# Patient Record
Sex: Male | Born: 1975 | Race: Black or African American | Hispanic: No | Marital: Married | Smoking: Current every day smoker
Health system: Southern US, Community
[De-identification: ages and names within clinical notes are randomized; demographics above are authoritative.]

## PROBLEM LIST (undated history)

## (undated) DIAGNOSIS — E876 Hypokalemia: Secondary | ICD-10-CM

## (undated) DIAGNOSIS — E669 Obesity, unspecified: Secondary | ICD-10-CM

## (undated) DIAGNOSIS — I10 Essential (primary) hypertension: Secondary | ICD-10-CM

---

## 2016-03-31 ENCOUNTER — Other Ambulatory Visit: Payer: Self-pay

## 2016-03-31 ENCOUNTER — Emergency Department (HOSPITAL_COMMUNITY)
Admission: EM | Admit: 2016-03-31 | Discharge: 2016-03-31 | Disposition: A | Discharge status: Home / Self Care | Payer: Medicaid Other | Attending: Emergency Medicine | Admitting: Emergency Medicine

## 2016-03-31 ENCOUNTER — Emergency Department (HOSPITAL_COMMUNITY): Payer: Medicaid Other

## 2016-03-31 ENCOUNTER — Encounter (HOSPITAL_COMMUNITY): Payer: Self-pay | Admitting: *Deleted

## 2016-03-31 DIAGNOSIS — I10 Essential (primary) hypertension: Secondary | ICD-10-CM | POA: Insufficient documentation

## 2016-03-31 DIAGNOSIS — F17200 Nicotine dependence, unspecified, uncomplicated: Secondary | ICD-10-CM | POA: Insufficient documentation

## 2016-03-31 DIAGNOSIS — R072 Precordial pain: Secondary | ICD-10-CM | POA: Diagnosis not present

## 2016-03-31 DIAGNOSIS — Z79899 Other long term (current) drug therapy: Secondary | ICD-10-CM | POA: Diagnosis not present

## 2016-03-31 DIAGNOSIS — R079 Chest pain, unspecified: Secondary | ICD-10-CM

## 2016-03-31 HISTORY — DX: Hypokalemia: E87.6

## 2016-03-31 HISTORY — DX: Essential (primary) hypertension: I10

## 2016-03-31 HISTORY — DX: Obesity, unspecified: E66.9

## 2016-03-31 LAB — CBC
HEMATOCRIT: 39.9 % (ref 39.0–52.0)
Hemoglobin: 13.4 g/dL (ref 13.0–17.0)
MCH: 28.2 pg (ref 26.0–34.0)
MCHC: 33.6 g/dL (ref 30.0–36.0)
MCV: 84 fL (ref 78.0–100.0)
Platelets: 196 10*3/uL (ref 150–400)
RBC: 4.75 MIL/uL (ref 4.22–5.81)
RDW: 14.6 % (ref 11.5–15.5)
WBC: 8.6 10*3/uL (ref 4.0–10.5)

## 2016-03-31 LAB — BASIC METABOLIC PANEL
Anion gap: 9 (ref 5–15)
BUN: 20 mg/dL (ref 6–20)
CHLORIDE: 107 mmol/L (ref 101–111)
CO2: 22 mmol/L (ref 22–32)
Calcium: 9.8 mg/dL (ref 8.9–10.3)
Creatinine, Ser: 1.15 mg/dL (ref 0.61–1.24)
GFR calc Af Amer: >60 mL/min (ref >60)
GFR calc non Af Amer: >60 mL/min (ref >60)
GLUCOSE: 110 mg/dL — AB (ref 65–99)
POTASSIUM: 3.8 mmol/L (ref 3.5–5.1)
Sodium: 138 mmol/L (ref 135–145)

## 2016-03-31 LAB — I-STAT TROPONIN, ED: TROPONIN I, POC: 0.01 ng/mL (ref 0.00–0.08)

## 2016-03-31 MED ORDER — PANTOPRAZOLE SODIUM 20 MG PO TBEC: 20.0000 mg | DELAYED_RELEASE_TABLET | Freq: Every day | ORAL | 0 refills | Status: AC

## 2016-03-31 NOTE — Discharge Instructions (Signed)
Return to the ED with any concerns including difficulty breathing, worsening chest pain, leg swelling, fainting, decreased level of alertness/lethargy, or any other alarming symptoms °

## 2016-03-31 NOTE — ED Provider Notes (Signed)
[facility]-EMERGENCY DEPT Provider Note   CSN: [id number] Arrival date & time: 03/31/16  1348     History   Chief Complaint Chief Complaint  Patient presents with  . Chest Pain  . Shortness of Breath    HPI Gregory Savage is a 40 y.o. male.  HPI  Pt presenting with c/o chest pain. He has hx of hypertension.  He states he was seated at work today and began to feel pain in left side of chest under axilla.  Also in substernal region.  He states he did not have associated nausea, diaphoresis or shortness of breath.  He also states he has been burping more frequently.  When he burps he feels relief. No leg swelling.  No recent travel/trauma/surgery.  No hx of DVT/PE.  Pt has not taken anything for his symptoms.  His pain has been constant since 8am this morning.  There are no other associated systemic symptoms, there are no other alleviating or modifying factors.   Past Medical History:  Diagnosis Date  . Hypertension   . Hypokalemia   . Obesity     There are no active problems to display for this patient.   History reviewed. No pertinent surgical history.     Home Medications    Prior to Admission medications   Medication Sig Start Date End Date Taking? Authorizing Provider  amoxicillin-clavulanate (AUGMENTIN) 875-125 MG tablet Take 1 tablet by mouth 2 (two) times daily.   Yes Historical Provider, MD  carvedilol (COREG) 12.5 MG tablet Take 12.5 mg by mouth 2 (two) times daily with a meal.   Yes Historical Provider, MD  colchicine 0.6 MG tablet Take 0.6 mg by mouth daily.   Yes Historical Provider, MD  eplerenone (INSPRA) 50 MG tablet Take 50 mg by mouth daily.   Yes Historical Provider, MD  NIFEdipine (PROCARDIA XL/ADALAT-CC) 60 MG 24 hr tablet Take 60 mg by mouth daily.   Yes Historical Provider, MD  potassium chloride SA (K-DUR,KLOR-CON) 20 MEQ tablet Take 20 mEq by mouth daily.   Yes Historical Provider, MD  pantoprazole (PROTONIX) 20 MG tablet Take 1 tablet (20 mg total) by  mouth daily. 03/31/16   Jerelyn Scott, MD    Family History History reviewed. No pertinent family history.  Social History Social History  Substance Use Topics  . Smoking status: Current Every Day Smoker  . Smokeless tobacco: Not on file  . Alcohol use Yes     Comment: occ     Allergies   Codeine and Lisinopril   Review of Systems Review of Systems  ROS reviewed and all otherwise negative except for mentioned in HPI   Physical Exam Updated Vital Signs BP 134/87   Pulse 81   Temp 98.5 F (36.9 C) (Oral)   Resp 16   SpO2 96%  Vitals reviewed Physical Exam Physical Examination: General appearance - alert, well appearing, and in no distress Mental status - alert, oriented to person, place, and time Eyes - no conjunctival injection no scleral icterus Mouth - mucous membranes moist, pharynx normal without lesions Chest - clear to auscultation, no wheezes, rales or rhonchi, symmetric air entry Heart - normal rate, regular rhythm, normal S1, S2, no murmurs, rubs, clicks or gallops Abdomen - soft, nontender, nondistended, no masses or organomegaly Neurological - alert, oriented, normal speech Extremities - peripheral pulses normal, no pedal edema, no clubbing or cyanosis Skin - normal coloration and turgor, no rashes  ED Treatments / Results  Labs (all labs ordered are listed,  but only abnormal results are displayed) Labs Reviewed  BASIC METABOLIC PANEL - Abnormal; Notable for the following:       Result Value   Glucose, Bld 110 (*)    All other components within normal limits  CBC  I-STAT TROPOININ, ED    EKG  EKG Interpretation  Date/Time:  Wednesday March 31 2016 14:00:47 EDT Ventricular Rate:  80 PR Interval:  164 QRS Duration: 92 QT Interval:  344 QTC Calculation: 396 R Axis:   69 Text Interpretation:  Normal sinus rhythm T wave abnormality, consider inferior ischemia Abnormal ECG No old tracing to compare Confirmed by [facility]  MD, Zandrea Kenealy [phone]([id number]) on  03/31/2016 6:10:45 PM       Radiology No results found.  Procedures Procedures (including critical care time)  Medications Ordered in ED Medications - No data to display   Initial Impression / Assessment and Plan / ED Course  I have reviewed the triage vital signs and the nursing notes.  Pertinent labs & imaging results that were available during my care of the patient were reviewed by me and considered in my medical decision making (see chart for details).  Clinical Course    Pt presenting with c/o chest pain and shortness of breath.  Pain has been constant since this morning.  PERC 0 , so very low risk for PE.  Heart score of 2, low risk for ACS.  Troponin is negative.  Pt is requesting discharge.  Discharged with strict return precautions.  Pt agreeable with plan.  Final Clinical Impressions(s) / ED Diagnoses   Final diagnoses:  Chest pain, unspecified type    New Prescriptions Discharge Medication List as of 03/31/2016  6:38 PM    START taking these medications   Details  pantoprazole (PROTONIX) 20 MG tablet Take 1 tablet (20 mg total) by mouth daily., Starting Wed 03/31/2016, Print         Jerelyn ScottMartha Linker, MD 04/03/16 [phone]

## 2016-03-31 NOTE — ED Notes (Signed)
Papers reviewed with patient and he verbalizes understanding. Medications discussed and patient leaving ambulatory by himself

## 2016-03-31 NOTE — ED Triage Notes (Signed)
Pt reports left side chest pain this am and having sob. Denies recent cough. No resp distress noted. ekg done at triage.

## 2017-05-28 IMAGING — DX DG CHEST 2V
2 series · 2 of 2 positions shown · non-contrast
Comparison: None.

CLINICAL DATA: Left-sided chest pain and shortness of Breath

EXAM:
CHEST  2 VIEW

[chest pa]
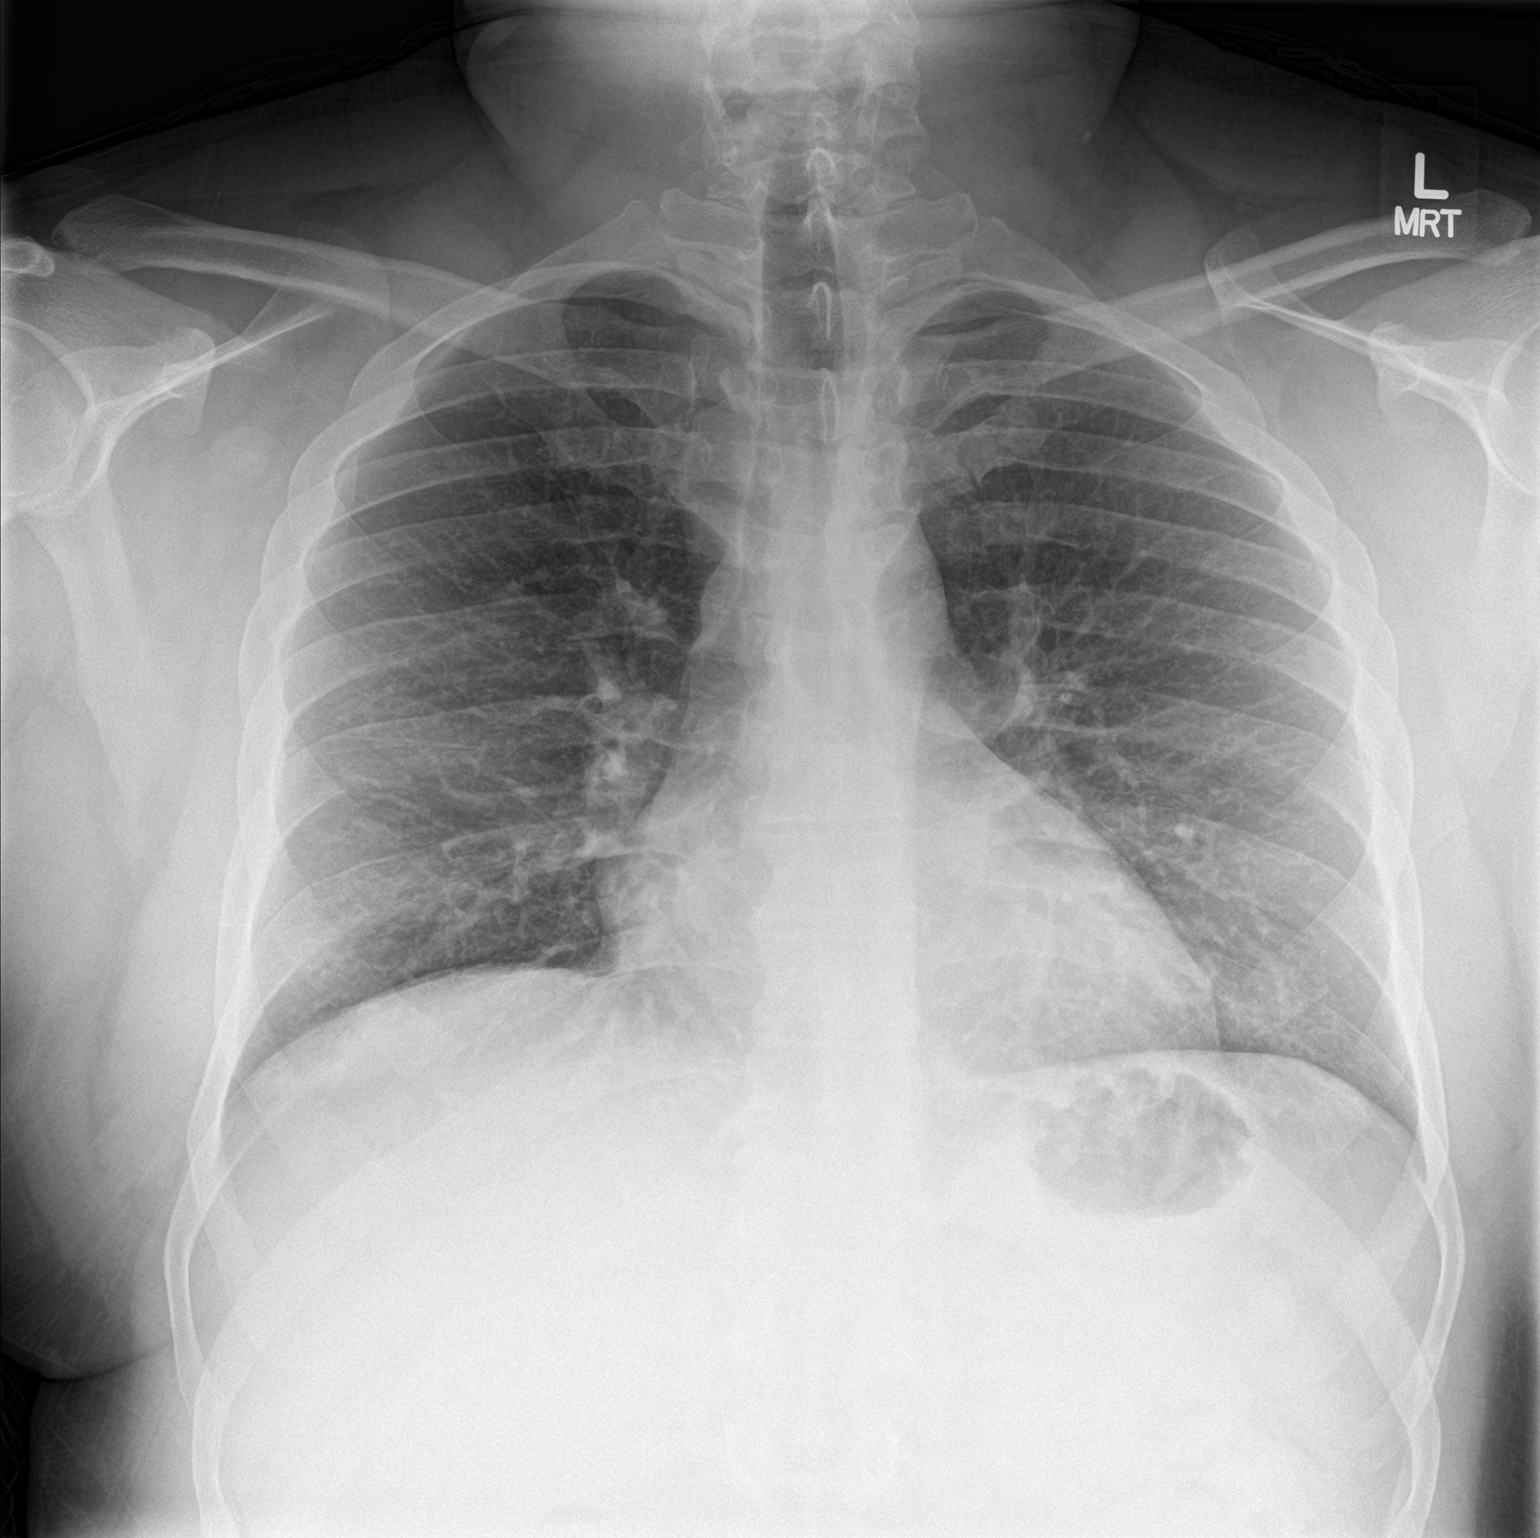

[chest lat]
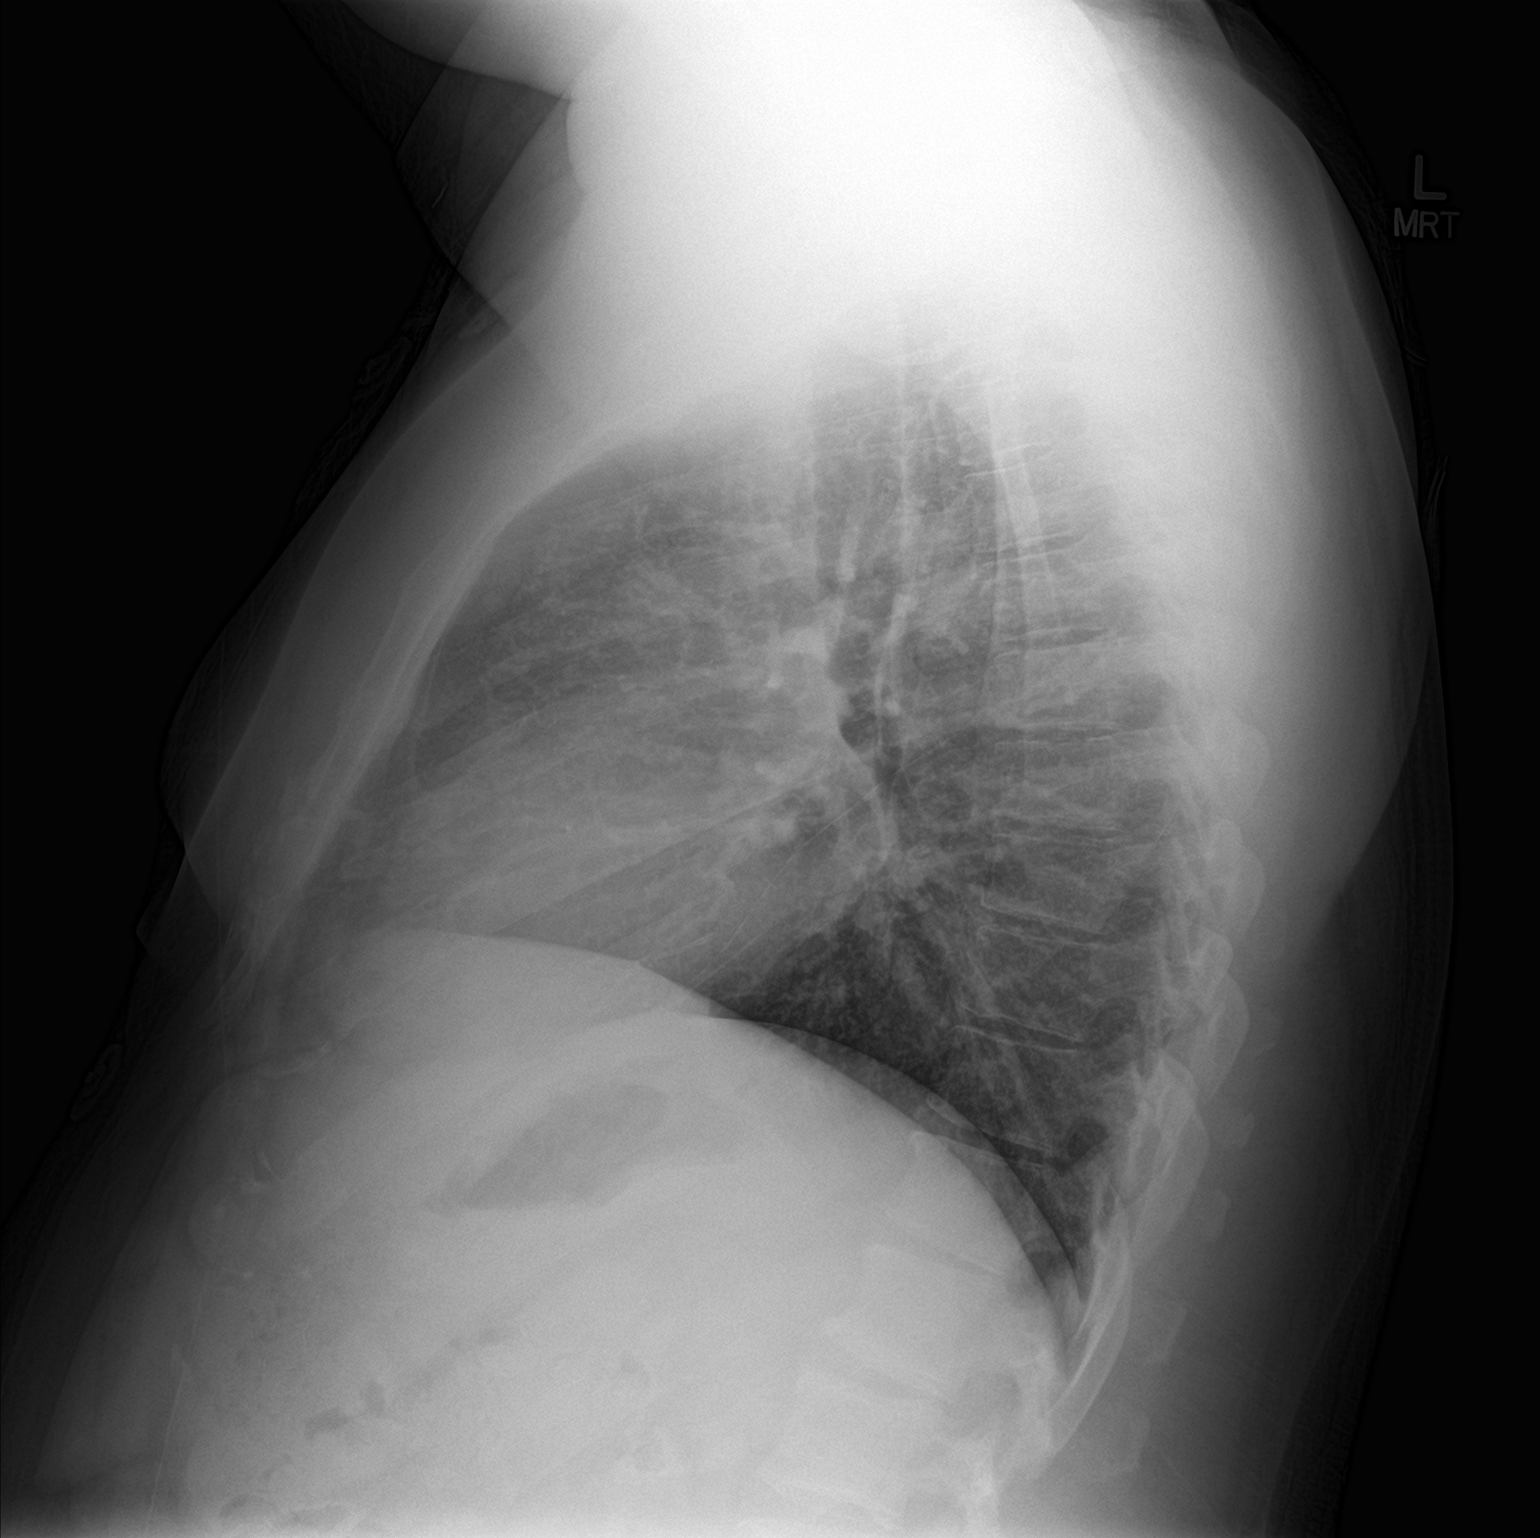

[2 of 2 positions shown; findings below may reference images not displayed]

FINDINGS: The heart size and mediastinal contours are within normal limits.
Both lungs are clear. The visualized skeletal structures are
unremarkable.
IMPRESSION: No active cardiopulmonary disease.
